# Patient Record
Sex: Male | Born: 1955 | Race: White | Hispanic: No | Marital: Married | Smoking: Never smoker
Health system: Southern US, Community
[De-identification: ages and names within clinical notes are randomized; demographics above are authoritative.]

## PROBLEM LIST (undated history)

## (undated) DIAGNOSIS — I10 Essential (primary) hypertension: Secondary | ICD-10-CM

## (undated) DIAGNOSIS — M542 Cervicalgia: Secondary | ICD-10-CM

## (undated) DIAGNOSIS — E119 Type 2 diabetes mellitus without complications: Secondary | ICD-10-CM

## (undated) DIAGNOSIS — E785 Hyperlipidemia, unspecified: Secondary | ICD-10-CM

## (undated) HISTORY — PX: TONSILLECTOMY: SUR1361

---

## 1998-06-07 ENCOUNTER — Encounter: Admission: RE | Admit: 1998-06-07 | Discharge: 1998-09-05 | Payer: Self-pay | Admitting: Family Medicine

## 2002-04-05 ENCOUNTER — Encounter: Admission: RE | Admit: 2002-04-05 | Discharge: 2002-04-05 | Payer: Self-pay | Admitting: Family Medicine

## 2002-04-12 ENCOUNTER — Encounter: Admission: RE | Admit: 2002-04-12 | Discharge: 2002-04-12 | Payer: Self-pay | Admitting: Family Medicine

## 2002-09-02 ENCOUNTER — Encounter: Admission: RE | Admit: 2002-09-02 | Discharge: 2002-09-02 | Payer: Self-pay | Admitting: Sports Medicine

## 2006-01-15 ENCOUNTER — Ambulatory Visit: Payer: Self-pay | Admitting: Family Medicine

## 2006-01-23 ENCOUNTER — Ambulatory Visit: Payer: Self-pay | Admitting: Family Medicine

## 2006-02-12 ENCOUNTER — Ambulatory Visit: Payer: Self-pay | Admitting: Gastroenterology

## 2006-02-25 ENCOUNTER — Ambulatory Visit: Payer: Self-pay | Admitting: Gastroenterology

## 2010-11-06 ENCOUNTER — Ambulatory Visit: Payer: Self-pay | Admitting: Gastroenterology

## 2010-11-08 LAB — PATHOLOGY REPORT

## 2011-03-13 ENCOUNTER — Encounter: Payer: Self-pay | Admitting: Gastroenterology

## 2012-01-16 ENCOUNTER — Encounter: Payer: Self-pay | Admitting: Gastroenterology

## 2012-01-24 ENCOUNTER — Ambulatory Visit: Payer: Self-pay | Admitting: Family Medicine

## 2013-04-18 IMAGING — CT CT STONE STUDY
1 of 2 series · 15 of 32 positions shown, 19 images · non-contrast
Comparison: none

REASON FOR EXAM: Rt Flank Pain Hx Stones
COMMENTS:

PROCEDURE:     CT  - CT ABDOMEN /PELVIS WO (STONE)  - January 24, 2012  [DATE]
RESULT:     Comparison: None
TECHNIQUE: Multiple axial images from the lung bases to the symphysis pubis
were obtained without oral and without intravenous contrast.

[Series 2: 3mm soft tissue · axial · 0.83mm/px · z∈[-968,-491]mm · 15 of 175 slices shown, 19 images]
[im 8/175  soft-tissue]
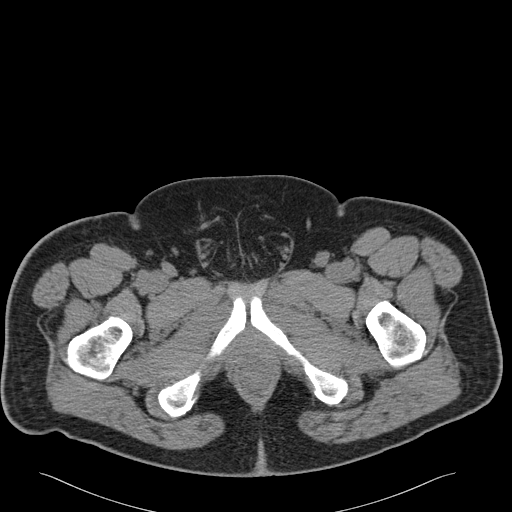
[im 8/175  bone]
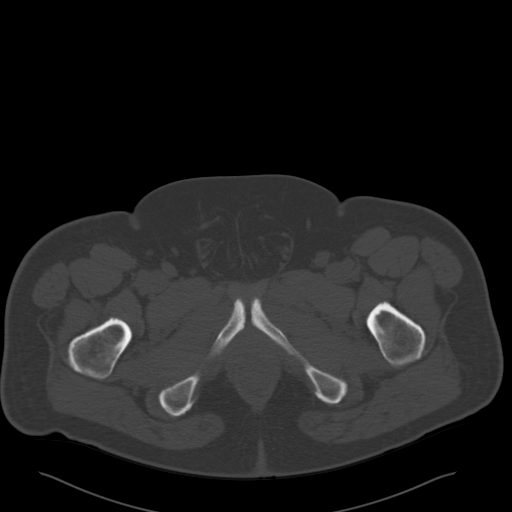
[im 23/175  soft-tissue]
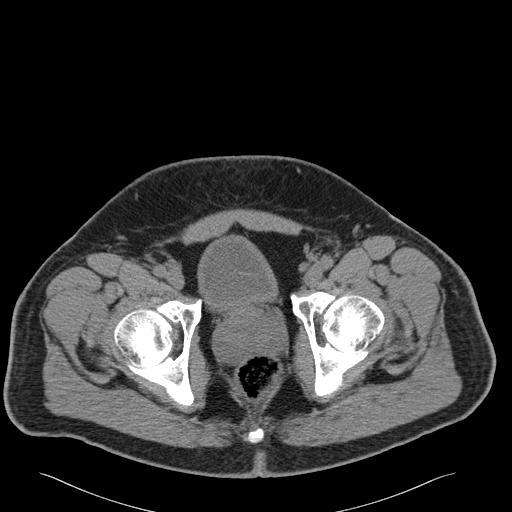
[im 38/175  soft-tissue]
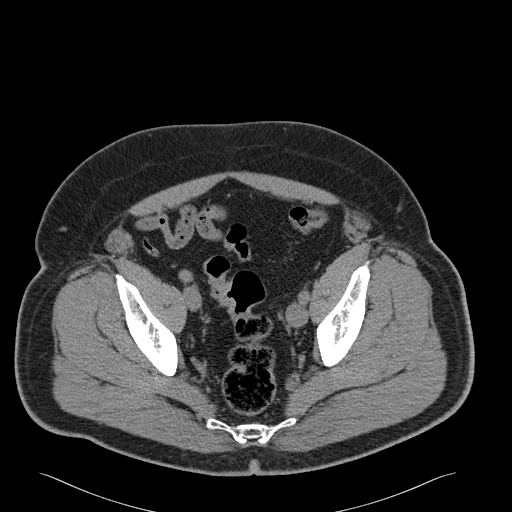
[im 46/175  soft-tissue]
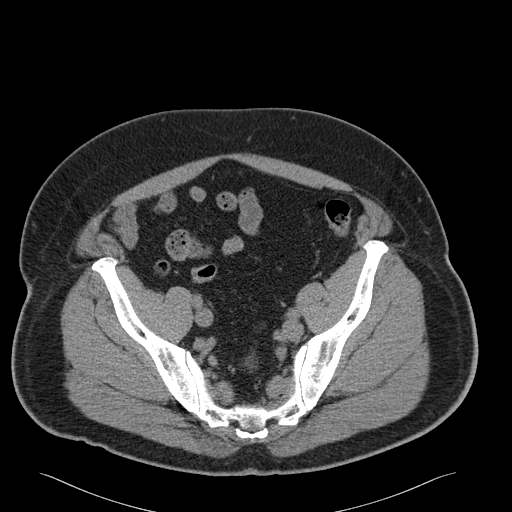
[im 61/175  soft-tissue]
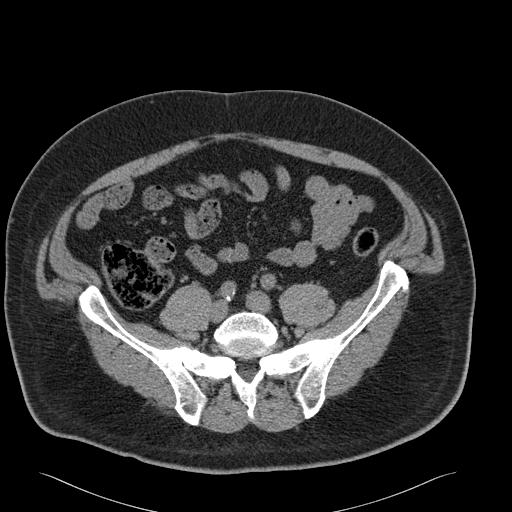
[im 76/175  soft-tissue]
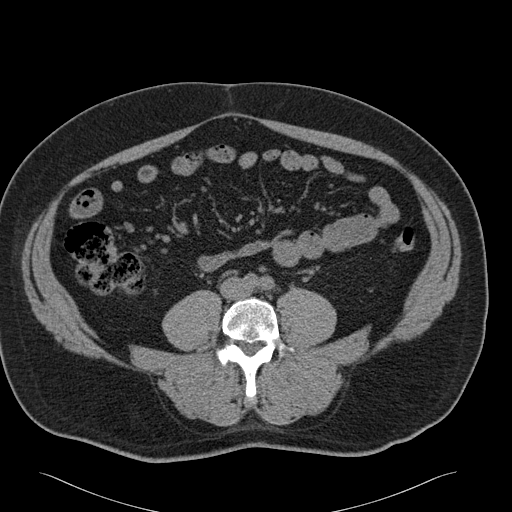
[im 91/175  soft-tissue]
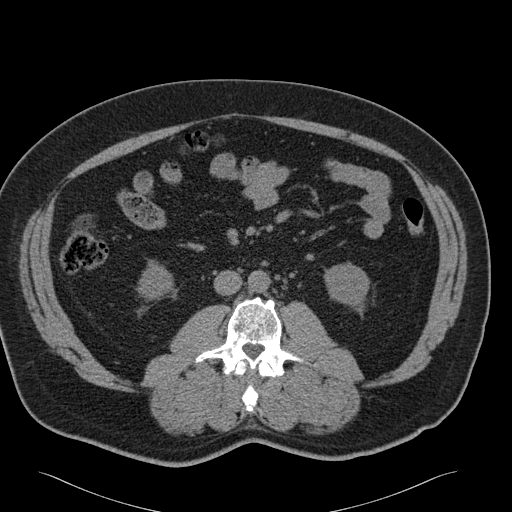
[im 99/175  soft-tissue]
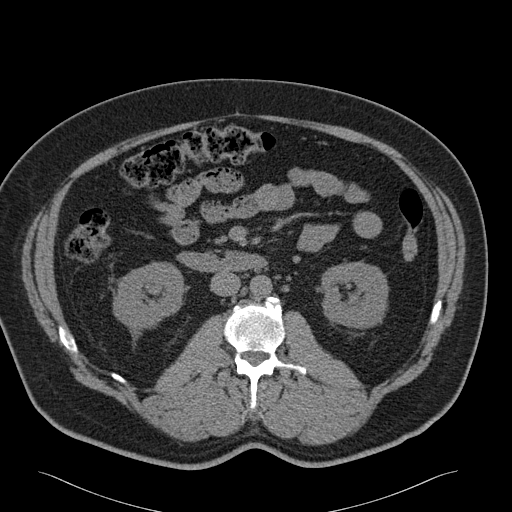
[im 114/175  soft-tissue]
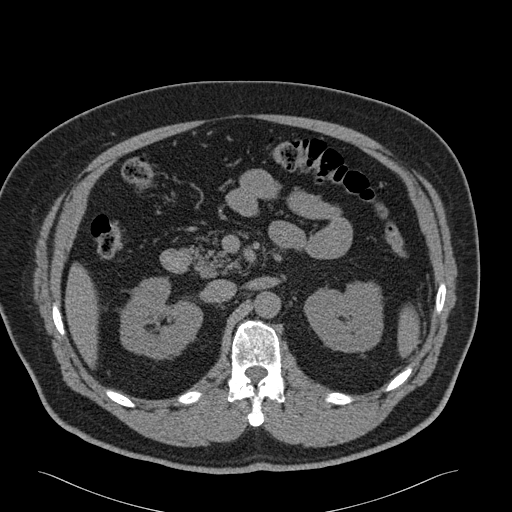
[im 114/175  bone]
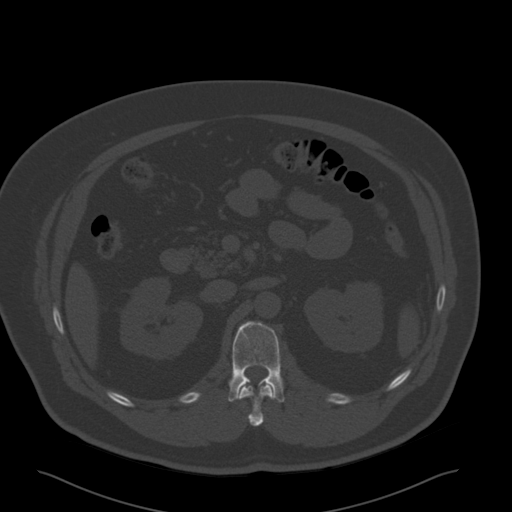
[im 129/175  soft-tissue]
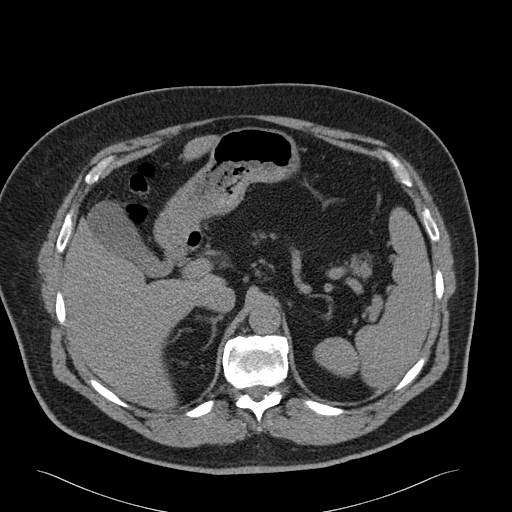
[im 137/175  soft-tissue]
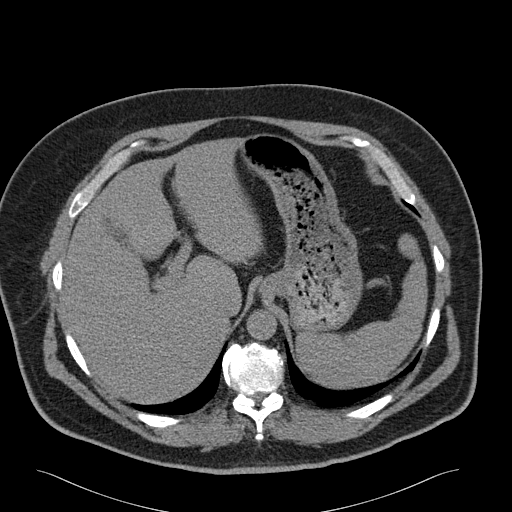
[im 144/175  lung]
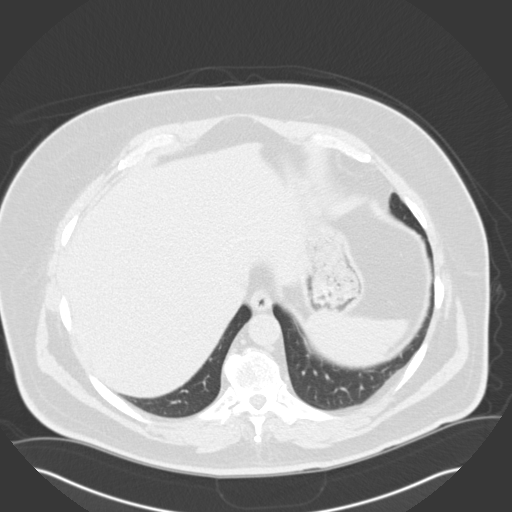
[im 152/175  soft-tissue]
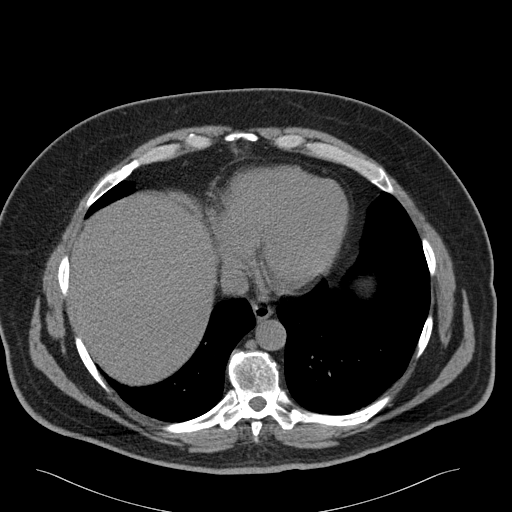
[im 152/175  lung]
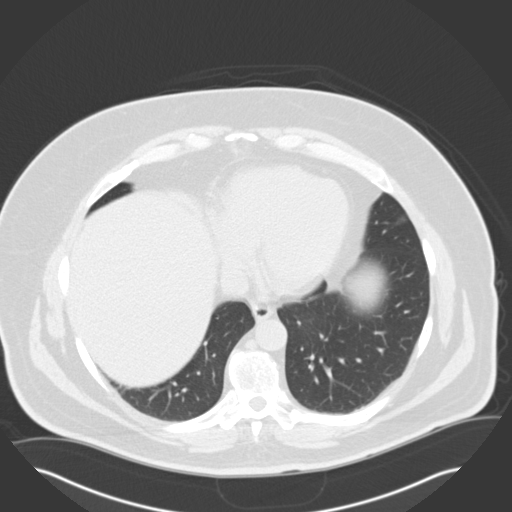
[im 159/175  lung]
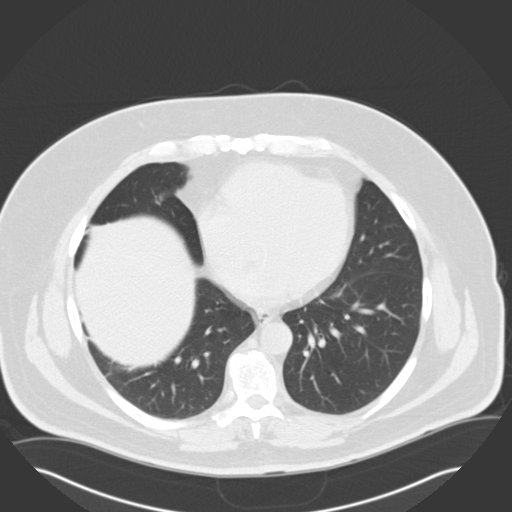
[im 167/175  soft-tissue]
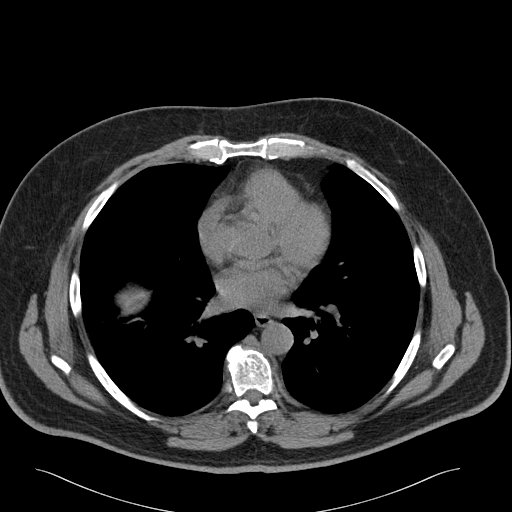
[im 167/175  lung]
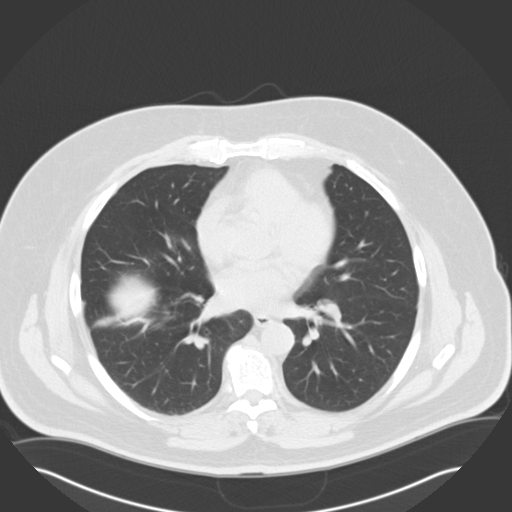

[15 of 32 positions shown; findings below may reference images not displayed]

FINDINGS: Lack of intravenous contrast limits evaluation of the solid abdominal
organs.  Grossly, the liver, gallbladder, spleen, adrenals, and pancreas are
unremarkable. No renal calculi or hydronephrosis. There is mild perinephric
stranding, which is nonspecific. No ureterectasis. Mild prominence of the
bladder wall is likely secondary to underdistention. The prostate is
enlarged. The small and large bowel are normal in caliber. The appendix is
normal. There is a small fat-containing periumbilical hernia.

Small round sclerotic density in the left acetabulum likely represents a
bone island.
IMPRESSION: No renal calculi or hydronephrosis.

## 2015-04-19 ENCOUNTER — Encounter
Admission: RE | Admit: 2015-04-19 | Discharge: 2015-04-19 | Disposition: A | Discharge status: Home / Self Care | Payer: BLUE CROSS/BLUE SHIELD | Source: Ambulatory Visit | Attending: Surgery | Admitting: Surgery

## 2015-04-19 DIAGNOSIS — Z01810 Encounter for preprocedural cardiovascular examination: Secondary | ICD-10-CM | POA: Insufficient documentation

## 2015-04-19 DIAGNOSIS — Z01812 Encounter for preprocedural laboratory examination: Secondary | ICD-10-CM | POA: Insufficient documentation

## 2015-04-19 HISTORY — DX: Type 2 diabetes mellitus without complications: E11.9

## 2015-04-19 LAB — BASIC METABOLIC PANEL
Anion gap: 6 (ref 5–15)
BUN: 20 mg/dL (ref 6–20)
CALCIUM: 9.4 mg/dL (ref 8.9–10.3)
CO2: 29 mmol/L (ref 22–32)
Chloride: 103 mmol/L (ref 101–111)
Creatinine, Ser: 0.77 mg/dL (ref 0.61–1.24)
GFR calc Af Amer: >60 mL/min (ref >60)
GLUCOSE: 184 mg/dL — AB (ref 65–99)
Potassium: 4.3 mmol/L (ref 3.5–5.1)
Sodium: 138 mmol/L (ref 135–145)

## 2015-04-19 NOTE — Patient Instructions (Signed)
  Your procedure is scheduled [id number] 10, 2017 (Tuesday) Report to Day Surgery.[facility]) Second Floor To find out your arrival time please call [phone] between 1PM - 3PM on April 24, 2015 (Monday).  Remember: Instructions that are not followed completely may result in serious medical risk, up to and including death, or upon the discretion of your surgeon and anesthesiologist your surgery may need to be rescheduled.    __x__ 1. Do not eat food or drink liquids after midnight. No gum chewing or hard candies.     ____ 2. No Alcohol for 24 hours before or after surgery.   ____ 3. Bring all medications with you on the day of surgery if instructed.    __x__ 4. Notify your doctor if there is any change in your medical condition     (cold, fever, infections).     Do not wear jewelry, make-up, hairpins, clips or nail polish.  Do not wear lotions, powders, or perfumes. You may wear deodorant.  Do not shave 48 hours prior to surgery. Men may shave face and neck.  Do not bring valuables to the hospital.    [facility] is not responsible for any belongings or valuables.               Contacts, dentures or bridgework may not be worn into surgery.  Leave your suitcase in the car. After surgery it may be brought to your room.  For patients admitted to the hospital, discharge time is determined by your                treatment team.   Patients discharged the day of surgery will not be allowed to drive home.   Please read over the following fact sheets that you were given:   Surgical Site Infection Prevention   ____ Take these medicines the morning of surgery with A SIP OF WATER:    1.   2.   3.   4.  5.  6.  ____ Fleet Enema (as directed)   ____ Use CHG Soap as directed  ____ Use inhalers on the day of surgery  ____ Stop metformin 2 days prior to surgery    ____ Take 1/2 of usual insulin dose the night before surgery and none on the morning of surgery.   __x__ Stop  Coumadin/Plavix/aspirin on (PATIENT STOPPED ASPIRIN ON January 2)  ____ Stop Anti-inflammatories on     _x_ Stop supplements until after surgery. (STOP FISH OIL, CINNAMON AND MULTIVITAMIN NOW)  ____ Bring C-Pap to the hospital.

## 2015-04-25 ENCOUNTER — Encounter: Payer: Self-pay | Admitting: *Deleted

## 2015-04-25 ENCOUNTER — Ambulatory Visit
Admission: RE | Admit: 2015-04-25 | Discharge: 2015-04-25 | Disposition: A | Discharge status: Home / Self Care | Payer: BLUE CROSS/BLUE SHIELD | Source: Ambulatory Visit | Attending: Surgery | Admitting: Surgery

## 2015-04-25 ENCOUNTER — Ambulatory Visit: Payer: BLUE CROSS/BLUE SHIELD | Admitting: Anesthesiology

## 2015-04-25 ENCOUNTER — Encounter
Admission: RE | Disposition: A | Discharge status: Home / Self Care | Payer: Self-pay | Source: Ambulatory Visit | Attending: Surgery

## 2015-04-25 DIAGNOSIS — K429 Umbilical hernia without obstruction or gangrene: Secondary | ICD-10-CM | POA: Diagnosis present

## 2015-04-25 DIAGNOSIS — Z833 Family history of diabetes mellitus: Secondary | ICD-10-CM | POA: Diagnosis not present

## 2015-04-25 DIAGNOSIS — E119 Type 2 diabetes mellitus without complications: Secondary | ICD-10-CM | POA: Diagnosis not present

## 2015-04-25 DIAGNOSIS — I10 Essential (primary) hypertension: Secondary | ICD-10-CM | POA: Diagnosis not present

## 2015-04-25 DIAGNOSIS — Z79899 Other long term (current) drug therapy: Secondary | ICD-10-CM | POA: Diagnosis not present

## 2015-04-25 DIAGNOSIS — Z7982 Long term (current) use of aspirin: Secondary | ICD-10-CM | POA: Insufficient documentation

## 2015-04-25 DIAGNOSIS — Z8249 Family history of ischemic heart disease and other diseases of the circulatory system: Secondary | ICD-10-CM | POA: Insufficient documentation

## 2015-04-25 DIAGNOSIS — E785 Hyperlipidemia, unspecified: Secondary | ICD-10-CM | POA: Insufficient documentation

## 2015-04-25 DIAGNOSIS — Z800 Family history of malignant neoplasm of digestive organs: Secondary | ICD-10-CM | POA: Insufficient documentation

## 2015-04-25 HISTORY — PX: UMBILICAL HERNIA REPAIR: SHX196

## 2015-04-25 LAB — GLUCOSE, CAPILLARY
GLUCOSE-CAPILLARY: 147 mg/dL — AB (ref 65–99)
Glucose-Capillary: 149 mg/dL — ABNORMAL HIGH (ref 65–99)

## 2015-04-25 SURGERY — REPAIR, HERNIA, UMBILICAL, ADULT
Anesthesia: General

## 2015-04-25 MED ORDER — LIDOCAINE HCL (CARDIAC) 20 MG/ML IV SOLN
INTRAVENOUS | Status: DC | PRN
  Administered 2015-04-25: 60 mg via INTRAVENOUS

## 2015-04-25 MED ORDER — BUPIVACAINE-EPINEPHRINE (PF) 0.5% -1:200000 IJ SOLN
INTRAMUSCULAR | Status: AC
  Filled 2015-04-25: qty 30

## 2015-04-25 MED ORDER — SODIUM CHLORIDE 0.9 % IV SOLN
INTRAVENOUS | Status: DC
  Administered 2015-04-25: 12:00:00 via INTRAVENOUS

## 2015-04-25 MED ORDER — CEFAZOLIN SODIUM-DEXTROSE 2-3 GM-% IV SOLR
2.0000 g | Freq: Once | INTRAVENOUS | Status: AC
  Administered 2015-04-25: 2 g via INTRAVENOUS

## 2015-04-25 MED ORDER — MIDAZOLAM HCL 5 MG/5ML IJ SOLN
INTRAMUSCULAR | Status: DC | PRN
  Administered 2015-04-25: 2 mg via INTRAVENOUS

## 2015-04-25 MED ORDER — PROPOFOL 10 MG/ML IV BOLUS
INTRAVENOUS | Status: DC | PRN
  Administered 2015-04-25: 200 mg via INTRAVENOUS

## 2015-04-25 MED ORDER — BUPIVACAINE-EPINEPHRINE 0.5% -1:200000 IJ SOLN
INTRAMUSCULAR | Status: DC | PRN
Start: 2015-04-25 — End: 2015-04-25
  Administered 2015-04-25: 7 mL

## 2015-04-25 MED ORDER — FENTANYL CITRATE (PF) 100 MCG/2ML IJ SOLN
INTRAMUSCULAR | Status: DC | PRN
  Administered 2015-04-25 (×3): 50 ug via INTRAVENOUS

## 2015-04-25 MED ORDER — PROMETHAZINE HCL 25 MG/ML IJ SOLN: 6.2500 mg | INTRAMUSCULAR | Status: DC | PRN

## 2015-04-25 MED ORDER — HYDROCODONE-ACETAMINOPHEN 5-325 MG PO TABS: 1.0000 | ORAL_TABLET | ORAL | Status: DC | PRN

## 2015-04-25 MED ORDER — HYDROCODONE-ACETAMINOPHEN 5-325 MG PO TABS: 1.0000 | ORAL_TABLET | ORAL | Status: AC | PRN

## 2015-04-25 MED ORDER — FENTANYL CITRATE (PF) 100 MCG/2ML IJ SOLN: 25.0000 ug | INTRAMUSCULAR | Status: DC | PRN

## 2015-04-25 MED ORDER — FAMOTIDINE 20 MG PO TABS
ORAL_TABLET | ORAL | Status: AC
  Administered 2015-04-25: 20 mg via ORAL
  Filled 2015-04-25: qty 1

## 2015-04-25 MED ORDER — ONDANSETRON HCL 4 MG/2ML IJ SOLN
INTRAMUSCULAR | Status: DC | PRN
  Administered 2015-04-25: 4 mg via INTRAVENOUS

## 2015-04-25 MED ORDER — CEFAZOLIN SODIUM-DEXTROSE 2-3 GM-% IV SOLR
INTRAVENOUS | Status: AC
  Filled 2015-04-25: qty 50

## 2015-04-25 MED ORDER — FAMOTIDINE 20 MG PO TABS
20.0000 mg | ORAL_TABLET | Freq: Once | ORAL | Status: AC
  Administered 2015-04-25: 20 mg via ORAL

## 2015-04-25 SURGICAL SUPPLY — 25 items
BARD SOFT MESH ×2 IMPLANT
BLADE SURG 15 STRL LF DISP TIS (BLADE) ×1 IMPLANT
BLADE SURG 15 STRL SS (BLADE) ×3
CANISTER SUCT 1200ML W/VALVE (MISCELLANEOUS) ×3 IMPLANT
CHLORAPREP W/TINT 26ML (MISCELLANEOUS) ×3 IMPLANT
DRAPE LAPAROTOMY 77X122 PED (DRAPES) ×3 IMPLANT
GLOVE BIO SURGEON STRL SZ7.5 (GLOVE) ×3 IMPLANT
GOWN STRL REUS W/ TWL LRG LVL3 (GOWN DISPOSABLE) ×3 IMPLANT
GOWN STRL REUS W/TWL LRG LVL3 (GOWN DISPOSABLE) ×9
KIT RM TURNOVER STRD PROC AR (KITS) ×3 IMPLANT
LABEL OR SOLS (LABEL) ×3 IMPLANT
LIQUID BAND (GAUZE/BANDAGES/DRESSINGS) ×3 IMPLANT
MESH SYNTHETIC 4X6 SOFT BARD (Mesh General) ×1 IMPLANT
MESH SYNTHETIC SOFT BARD 4X6 (Mesh General) ×2 IMPLANT
NDL HYPO 25X1 1.5 SAFETY (NEEDLE) ×1 IMPLANT
NEEDLE HYPO 25X1 1.5 SAFETY (NEEDLE) ×3 IMPLANT
NS IRRIG 500ML POUR BTL (IV SOLUTION) ×3 IMPLANT
PACK BASIN MINOR ARMC (MISCELLANEOUS) ×3 IMPLANT
PAD GROUND ADULT SPLIT (MISCELLANEOUS) ×3 IMPLANT
SUT CHROMIC 3 0 SH 27 (SUTURE) ×3 IMPLANT
SUT CHROMIC 4 0 RB 1X27 (SUTURE) ×3 IMPLANT
SUT MNCRL+ 5-0 UNDYED PC-3 (SUTURE) ×1 IMPLANT
SUT MONOCRYL 5-0 (SUTURE) ×2
SUT SURGILON 0 30 BLK (SUTURE) ×6 IMPLANT
SYRINGE 10CC LL (SYRINGE) ×3 IMPLANT

## 2015-04-25 NOTE — Discharge Instructions (Signed)
Take Tylenol or Norco if needed for pain.  May resume aspirin on Thursday.  May shower.  Avoid straining and heavy lifting.  AMBULATORY SURGERY  DISCHARGE INSTRUCTIONS   1) The drugs that you were given will stay in your system until tomorrow so for the next 24 hours you should not:  A) Drive an automobile B) Make any legal decisions C) Drink any alcoholic beverage   2) You may resume regular meals tomorrow.  Today it is better to start with liquids and gradually work up to solid foods.  You may eat anything you prefer, but it is better to start with liquids, then soup and crackers, and gradually work up to solid foods.   3) Please notify your doctor immediately if you have any unusual bleeding, trouble breathing, redness and pain at the surgery site, drainage, fever, or pain not relieved by medication.    4) Additional Instructions:        Please contact your physician with any problems or Same Day Surgery at [phone], Monday through Friday 6 am to 4 pm, or [place] at [facility] number at [phone].

## 2015-04-25 NOTE — Anesthesia Preprocedure Evaluation (Signed)
Anesthesia Evaluation  Patient identified by MRN, date of birth, ID band Patient awake    Reviewed: Allergy & Precautions, H&P , NPO status , Patient's Chart, lab work & pertinent test results, reviewed documented beta blocker date and time   History of Anesthesia Complications Negative for: history of anesthetic complications  Airway Mallampati: I  TM Distance: >3 FB Neck ROM: full    Dental no notable dental hx. (+) Teeth Intact   Pulmonary neg pulmonary ROS,    Pulmonary exam normal breath sounds clear to auscultation       Cardiovascular Exercise Tolerance: Good negative cardio ROS Normal cardiovascular exam Rhythm:regular Rate:Normal     Neuro/Psych negative neurological ROS  negative psych ROS   GI/Hepatic negative GI ROS, Neg liver ROS,   Endo/Other  diabetes, Well Controlled  Renal/GU negative Renal ROS  negative genitourinary   Musculoskeletal   Abdominal   Peds  Hematology negative hematology ROS (+)   Anesthesia Other Findings Past Medical History:   Diabetes mellitus without complication (HCC)                 Reproductive/Obstetrics negative OB ROS                             Anesthesia Physical Anesthesia Plan  ASA: II  Anesthesia Plan: General   Post-op Pain Management:    Induction:   Airway Management Planned:   Additional Equipment:   Intra-op Plan:   Post-operative Plan:   Informed Consent: I have reviewed the patients History and Physical, chart, labs and discussed the procedure including the risks, benefits and alternatives for the proposed anesthesia with the patient or authorized representative who has indicated his/her understanding and acceptance.   Dental Advisory Given  Plan Discussed with: Anesthesiologist, CRNA and Surgeon  Anesthesia Plan Comments:         Anesthesia Quick Evaluation

## 2015-04-25 NOTE — Transfer of Care (Signed)
Immediate Anesthesia Transfer of Care Note  Patient: Glenn Casey  Procedure(s) Performed: Procedure(s): HERNIA REPAIR UMBILICAL ADULT (N/A)  Patient Location: PACU  Anesthesia Type:General  Level of Consciousness: sedated  Airway & Oxygen Therapy: Patient Spontanous Breathing and Patient connected to face mask oxygen  Post-op Assessment: Report given to RN and Post -op Vital signs reviewed and stable  Post vital signs: Reviewed and stable  Last Vitals:  Filed Vitals:   04/25/15 1107 04/25/15 1334  BP: 159/90 121/81  Pulse: 67 69  Temp: 36.8 C 36.3 C  Resp: 16 12    Complications: No apparent anesthesia complications

## 2015-04-25 NOTE — H&P (Signed)
  He reports no change in conditions that they have the office visit.  He is awake alert and oriented. The umbilical hernia was palpated  Have discussed the plan for surgery

## 2015-04-25 NOTE — Anesthesia Procedure Notes (Signed)
Procedure Name: LMA Insertion Date/Time: 04/25/2015 12:19 PM Performed by: Dionne Bucy Pre-anesthesia Checklist: Patient identified, Patient being monitored, Timeout performed, Emergency Drugs available and Suction available Patient Re-evaluated:Patient Re-evaluated prior to inductionOxygen Delivery Method: Circle system utilized Preoxygenation: Pre-oxygenation with 100% oxygen Intubation Type: IV induction Ventilation: Mask ventilation without difficulty LMA: LMA inserted LMA Size: 4.5 Tube type: Oral Number of attempts: 1 Placement Confirmation: positive ETCO2 and breath sounds checked- equal and bilateral Tube secured with: Tape Dental Injury: Teeth and Oropharynx as per pre-operative assessment

## 2015-04-25 NOTE — Op Note (Signed)
OPERATIVE REPORT  PREOPERATIVE  DIAGNOSIS: . Umbilical hernia  POSTOPERATIVE DIAGNOSIS: . Umbilical hernia  PROCEDURE: . Umbilical hernia repair  ANESTHESIA:  General  SURGEON: Rochel Brome  MD   INDICATIONS: . He reports a long history of umbilical hernia and recent development of some pain associated with it. An umbilical hernia was demonstrated on physical exam and repair was recommended for definitive treatment.  With the patient on the operating table in the supine position he was placed under general anesthesia. The abdomen was clipped and prepared with ChloraPrep and draped in a sterile manner.  A transversely oriented 4 cm supraumbilical curvilinear incision was made and carried down through subcutaneous tissues to encounter an umbilical hernia sac. The sac was dissected free from the umbilicus and dissected free from surrounding tissues and dissected free from the fascial ring defect. The properitoneal fat was separated from the fascial ring defect circumferentially extending back approximately 1 cm. Sac was inverted. The fascial ring defect was approximately 2 cm in dimension. Bard soft mesh was cut out to create an oval shape of 2 x 3 cm. This was placed into the properitoneal plane oriented transversely. It was sutured to the overlying fascia with 0 Surgilon through and through sutures with 4 point fixation. The fascia was closed with a transversely oriented suture line of interrupted 0 Surgilon figure-of-eight sutures incorporating each suture into the mesh. The deep fascia and subcutaneous tissues tissues were infiltrated with half percent Sensorcaine with epinephrine. The skin of the umbilicus was sutured to the deep fascia with 3-0 chromic. The skin was closed with running 5-0 Monocryl subcuticular suture and LiquiBand. The patient tolerated surgery satisfactorily and was prepared for transfer to the recovery room.   Rochel Brome M.D.

## 2015-04-26 NOTE — Anesthesia Postprocedure Evaluation (Signed)
Anesthesia Post Note  Patient: Glenn Casey  Procedure(s) Performed: Procedure(s) (LRB): HERNIA REPAIR UMBILICAL ADULT (N/A)  Patient location during evaluation: PACU Anesthesia Type: General Level of consciousness: awake and alert Pain management: pain level controlled Vital Signs Assessment: post-procedure vital signs reviewed and stable Respiratory status: spontaneous breathing, nonlabored ventilation, respiratory function stable and patient connected to nasal cannula oxygen Cardiovascular status: blood pressure returned to baseline and stable Postop Assessment: no signs of nausea or vomiting Anesthetic complications: no    Last Vitals:  Filed Vitals:   04/25/15 1428 04/25/15 1524  BP: 120/74 121/74  Pulse: 60 57  Temp: 35.6 C   Resp: 16 16    Last Pain:  Filed Vitals:   04/26/15 0830  PainSc: 1                  Martha Clan

## 2015-07-13 ENCOUNTER — Encounter: Payer: Self-pay | Admitting: Surgery

## 2019-06-25 ENCOUNTER — Ambulatory Visit: Payer: BLUE CROSS/BLUE SHIELD | Attending: Internal Medicine

## 2019-06-25 DIAGNOSIS — Z23 Encounter for immunization: Secondary | ICD-10-CM

## 2019-06-25 NOTE — Progress Notes (Signed)
   Covid-19 Vaccination Clinic  Name:  JASIYAH DIVELY    MRN: [id number] DOB: Jul 23, 1955  06/25/2019  Mr. Mancil was observed post Covid-19 immunization for 15 minutes without incident. He was provided with Vaccine Information Sheet and instruction to access the V-Safe system.   Mr. Maat was instructed to call 911 with any severe reactions post vaccine: Marland Kitchen Difficulty breathing  . Swelling of face and throat  . A fast heartbeat  . A bad rash all over body  . Dizziness and weakness   Immunizations Administered    Name Date Dose VIS Date Route   Pfizer COVID-19 Vaccine 06/25/2019 12:20 PM 0.3 mL 03/26/2019 Intramuscular   Manufacturer: [place]   Lot: [id number]   [place]: [id number]

## 2019-07-17 ENCOUNTER — Ambulatory Visit: Payer: BLUE CROSS/BLUE SHIELD | Attending: Internal Medicine

## 2019-07-17 ENCOUNTER — Other Ambulatory Visit: Payer: Self-pay

## 2019-07-17 ENCOUNTER — Ambulatory Visit: Payer: BLUE CROSS/BLUE SHIELD

## 2019-07-17 DIAGNOSIS — Z23 Encounter for immunization: Secondary | ICD-10-CM

## 2019-07-17 NOTE — Progress Notes (Signed)
   Covid-19 Vaccination Clinic  Name:  THAI MACKILLOP    MRN: [id number] DOB: [birth year]  07/17/2019  Mr. Schmieder was observed post Covid-19 immunization for 15 minutes without incident. He was provided with Vaccine Information Sheet and instruction to access the V-Safe system.   Mr. Lauby was instructed to call 911 with any severe reactions post vaccine: Marland Kitchen Difficulty breathing  . Swelling of face and throat  . A fast heartbeat  . A bad rash all over body  . Dizziness and weakness   Immunizations Administered    Name Date Dose VIS Date Route   Pfizer COVID-19 Vaccine 07/17/2019  3:44 PM 0.3 mL 03/26/2019 Intramuscular   Manufacturer: [place]   Lot: [id number]   [place]: [id number]

## 2019-07-20 ENCOUNTER — Ambulatory Visit: Payer: BLUE CROSS/BLUE SHIELD

## 2020-02-28 ENCOUNTER — Ambulatory Visit: Payer: BLUE CROSS/BLUE SHIELD | Admitting: Dermatology

## 2021-09-24 ENCOUNTER — Ambulatory Visit: Payer: Medicare Other | Admitting: Anesthesiology

## 2021-09-24 ENCOUNTER — Encounter
Admission: RE | Disposition: A | Discharge status: Home / Self Care | Payer: Self-pay | Source: Home / Self Care | Attending: Gastroenterology

## 2021-09-24 ENCOUNTER — Ambulatory Visit
Admission: RE | Admit: 2021-09-24 | Discharge: 2021-09-24 | Disposition: A | Discharge status: Home / Self Care | Payer: Medicare Other | Attending: Gastroenterology | Admitting: Gastroenterology

## 2021-09-24 ENCOUNTER — Encounter: Payer: Self-pay | Admitting: *Deleted

## 2021-09-24 DIAGNOSIS — Z800 Family history of malignant neoplasm of digestive organs: Secondary | ICD-10-CM | POA: Insufficient documentation

## 2021-09-24 DIAGNOSIS — Z1211 Encounter for screening for malignant neoplasm of colon: Secondary | ICD-10-CM | POA: Diagnosis present

## 2021-09-24 DIAGNOSIS — Z86010 Personal history of colonic polyps: Secondary | ICD-10-CM | POA: Diagnosis not present

## 2021-09-24 DIAGNOSIS — K5730 Diverticulosis of large intestine without perforation or abscess without bleeding: Secondary | ICD-10-CM | POA: Insufficient documentation

## 2021-09-24 DIAGNOSIS — K640 First degree hemorrhoids: Secondary | ICD-10-CM | POA: Insufficient documentation

## 2021-09-24 DIAGNOSIS — E119 Type 2 diabetes mellitus without complications: Secondary | ICD-10-CM | POA: Diagnosis not present

## 2021-09-24 DIAGNOSIS — D122 Benign neoplasm of ascending colon: Secondary | ICD-10-CM | POA: Insufficient documentation

## 2021-09-24 DIAGNOSIS — K648 Other hemorrhoids: Secondary | ICD-10-CM | POA: Diagnosis not present

## 2021-09-24 DIAGNOSIS — D175 Benign lipomatous neoplasm of intra-abdominal organs: Secondary | ICD-10-CM | POA: Insufficient documentation

## 2021-09-24 HISTORY — DX: Hyperlipidemia, unspecified: E78.5

## 2021-09-24 HISTORY — DX: Essential (primary) hypertension: I10

## 2021-09-24 HISTORY — DX: Cervicalgia: M54.2

## 2021-09-24 HISTORY — PX: COLONOSCOPY WITH PROPOFOL: SHX5780

## 2021-09-24 LAB — GLUCOSE, CAPILLARY: Glucose-Capillary: 143 mg/dL — ABNORMAL HIGH (ref 70–99)

## 2021-09-24 SURGERY — COLONOSCOPY WITH PROPOFOL
Anesthesia: General

## 2021-09-24 MED ORDER — PROPOFOL 10 MG/ML IV BOLUS
INTRAVENOUS | Status: AC
  Filled 2021-09-24: qty 40

## 2021-09-24 MED ORDER — SODIUM CHLORIDE 0.9 % IV SOLN
INTRAVENOUS | Status: DC
Start: 2021-09-24 — End: 2021-09-24
  Administered 2021-09-24: 20 mL/h via INTRAVENOUS

## 2021-09-24 MED ORDER — LIDOCAINE HCL (PF) 2 % IJ SOLN
INTRAMUSCULAR | Status: AC
  Filled 2021-09-24: qty 5

## 2021-09-24 MED ORDER — ONDANSETRON HCL 4 MG/2ML IJ SOLN
INTRAMUSCULAR | Status: AC
  Filled 2021-09-24: qty 2

## 2021-09-24 MED ORDER — PROPOFOL 500 MG/50ML IV EMUL
INTRAVENOUS | Status: DC | PRN
  Administered 2021-09-24: 200 ug/kg/min via INTRAVENOUS

## 2021-09-24 MED ORDER — LIDOCAINE HCL (CARDIAC) PF 100 MG/5ML IV SOSY
PREFILLED_SYRINGE | INTRAVENOUS | Status: DC | PRN
  Administered 2021-09-24: 50 mg via INTRAVENOUS

## 2021-09-24 MED ORDER — PROPOFOL 10 MG/ML IV BOLUS
INTRAVENOUS | Status: DC | PRN
  Administered 2021-09-24: 80 mg via INTRAVENOUS

## 2021-09-24 NOTE — Interval H&P Note (Signed)
History and Physical Interval Note:  09/24/2021 7:49 AM  Glenn Casey  has presented today for surgery, with the diagnosis of history colon polyps.  The various methods of treatment have been discussed with the patient and family. After consideration of risks, benefits and other options for treatment, the patient has consented to  Procedure(s) with comments: COLONOSCOPY WITH PROPOFOL (N/A) - DM as a surgical intervention.  The patient's history has been reviewed, patient examined, no change in status, stable for surgery.  I have reviewed the patient's chart and labs.  Questions were answered to the patient's satisfaction.     [name]  Ok to proceed with colonoscopy

## 2021-09-24 NOTE — Transfer of Care (Signed)
Immediate Anesthesia Transfer of Care Note  Patient: Glenn Casey  Procedure(s) Performed: COLONOSCOPY WITH PROPOFOL  Patient Location: PACU  Anesthesia Type:General  Level of Consciousness: sedated  Airway & Oxygen Therapy: Patient Spontanous Breathing  Post-op Assessment: Report given to RN and Post -op Vital signs reviewed and stable  Post vital signs: Reviewed and stable  Last Vitals:  Vitals Value Taken Time  BP 95/65 09/24/21 0816  Temp 35.7 C 09/24/21 0816  Pulse 69 09/24/21 0816  Resp 13 09/24/21 0816  SpO2 98 % 09/24/21 0816    Last Pain:  Vitals:   09/24/21 0816  TempSrc: Temporal  PainSc: Asleep         Complications: No notable events documented.

## 2021-09-24 NOTE — Anesthesia Preprocedure Evaluation (Signed)
Anesthesia Evaluation  Patient identified by MRN, date of birth, ID band Patient awake    Reviewed: Allergy & Precautions, NPO status , Patient's Chart, lab work & pertinent test results  History of Anesthesia Complications Negative for: history of anesthetic complications  Airway Mallampati: III  TM Distance: <3 FB Neck ROM: full    Dental  (+) Chipped   Pulmonary neg pulmonary ROS, neg shortness of breath,    Pulmonary exam normal        Cardiovascular Exercise Tolerance: Good hypertension, (-) anginaNormal cardiovascular exam     Neuro/Psych negative neurological ROS  negative psych ROS   GI/Hepatic negative GI ROS, Neg liver ROS, neg GERD  ,  Endo/Other  diabetes, Type 2  Renal/GU negative Renal ROS  negative genitourinary   Musculoskeletal   Abdominal   Peds  Hematology negative hematology ROS (+)   Anesthesia Other Findings Past Medical History: No date: Cervicalgia No date: Diabetes mellitus without complication (HCC) No date: Hyperlipidemia No date: Hypertension  Past Surgical History: No date: TONSILLECTOMY 0/93/2355: UMBILICAL HERNIA REPAIR; N/A     Comment:  Procedure: HERNIA REPAIR UMBILICAL ADULT;  Surgeon:               Leonie Green, MD;  Location: [facility] ORS;  Service:               General;  Laterality: N/A;  BMI    Body Mass Index: 29.41 kg/m      Reproductive/Obstetrics negative OB ROS                            Anesthesia Physical Anesthesia Plan  ASA: 3  Anesthesia Plan: General   Post-op Pain Management:    Induction: Intravenous  PONV Risk Score and Plan: Propofol infusion and TIVA  Airway Management Planned: Natural Airway and Nasal Cannula  Additional Equipment:   Intra-op Plan:   Post-operative Plan:   Informed Consent: I have reviewed the patients History and Physical, chart, labs and discussed the procedure including the risks,  benefits and alternatives for the proposed anesthesia with the patient or authorized representative who has indicated his/her understanding and acceptance.     Dental Advisory Given  Plan Discussed with: Anesthesiologist, CRNA and Surgeon  Anesthesia Plan Comments: (Patient consented for risks of anesthesia including but not limited to:  - adverse reactions to medications - risk of airway placement if required - damage to eyes, teeth, lips or other oral mucosa - nerve damage due to positioning  - sore throat or hoarseness - Damage to heart, brain, nerves, lungs, other parts of body or loss of life  Patient voiced understanding.)       Anesthesia Quick Evaluation

## 2021-09-24 NOTE — H&P (Signed)
Outpatient short stay form Pre-procedure 09/24/2021  Glenn Rubenstein, MD  Primary Physician: [place] Pink, MD  Reason for visit:  Surveillance  History of present illness:    66 y/o gentleman with history of DM II here for surveillance colonoscopy. No blood thinners. Last colonoscopy was in 2017 with adenomatous polyp. Had grandfather with colon cancer. History of umbilical hernia repair.    Current Facility-Administered Medications:    0.9 %  sodium chloride infusion, , Intravenous, Continuous, Opie Fanton, Hilton Cork, MD, Last Rate: 20 mL/hr at 09/24/21 0714, 20 mL/hr at 09/24/21 0714  Medications Prior to Admission  Medication Sig Dispense Refill Last Dose   aspirin 81 MG tablet Take 81 mg by mouth daily.   Past Week   Cinnamon 500 MG capsule Take 500 mg by mouth 2 (two) times daily.   Past Week   empagliflozin (JARDIANCE) 25 MG TABS tablet Take 25 mg by mouth daily.   Past Week   HYDROcodone-acetaminophen (NORCO) 5-325 MG tablet Take 1-2 tablets by mouth every 4 (four) hours as needed for moderate pain. 12 tablet 0 Past Week   Multiple Vitamin (MULTIVITAMIN) tablet Take 1 tablet by mouth daily.   Past Week   Omega-3 Fatty Acids (FISH OIL) 1000 MG CAPS Take 1,000 mg by mouth 2 (two) times daily.   Past Week   sildenafil (REVATIO) 20 MG tablet Take 20 mg by mouth as needed.   Past Week     No Known Allergies   Past Medical History:  Diagnosis Date   Cervicalgia    Diabetes mellitus without complication ([place])    Hyperlipidemia    Hypertension     Review of systems:  Otherwise negative.    Physical Exam  Gen: Alert, oriented. Appears stated age.  HEENT: PERRLA. Lungs: No respiratory distress CV: RRR Abd: soft, benign, no masses Ext: No edema    Planned procedures: Proceed with colonoscopy. The patient understands the nature of the planned procedure, indications, risks, alternatives and potential complications including but not limited to bleeding, infection,  perforation, damage to internal organs and possible oversedation/side effects from anesthesia. The patient agrees and gives consent to proceed.  Please refer to procedure notes for findings, recommendations and patient disposition/instructions.     Glenn Rubenstein, MD [facility]

## 2021-09-24 NOTE — Anesthesia Postprocedure Evaluation (Signed)
Anesthesia Post Note  Patient: Glenn Casey  Procedure(s) Performed: COLONOSCOPY WITH PROPOFOL  Patient location during evaluation: Endoscopy Anesthesia Type: General Level of consciousness: awake and alert Pain management: pain level controlled Vital Signs Assessment: post-procedure vital signs reviewed and stable Respiratory status: spontaneous breathing, nonlabored ventilation, respiratory function stable and patient connected to nasal cannula oxygen Cardiovascular status: blood pressure returned to baseline and stable Postop Assessment: no apparent nausea or vomiting Anesthetic complications: no   No notable events documented.   Last Vitals:  Vitals:   09/24/21 0826 09/24/21 0836  BP: (!) 109/58 115/73  Pulse: 61 62  Resp: 12 16  Temp:    SpO2: 97% 98%    Last Pain:  Vitals:   09/24/21 0826  TempSrc:   PainSc: 0-No pain                 Precious Haws Colton Tassin

## 2021-09-24 NOTE — Op Note (Signed)
[facility] Gastroenterology Patient Name: Glenn Casey Procedure Date: 09/24/2021 7:12 AM MRN: [id number] Account #: [id number] Date of Birth: May 07, 1955 Admit Type: Outpatient Age: 66 Room: [facility] ENDO ROOM 3 Gender: Male Note Status: Finalized Instrument Name: Jasper Riling [id number] Procedure:             Colonoscopy Indications:           Surveillance: Personal history of adenomatous polyps                         on last colonoscopy > 5 years ago Providers:             Andrey Farmer MD, MD Referring MD:          Irven Easterly. Kary Kos, MD (Referring MD) Medicines:             Monitored Anesthesia Care Complications:         No immediate complications. Estimated blood loss:                         Minimal. Procedure:             Pre-Anesthesia Assessment:                        - Prior to the procedure, a History and Physical was                         performed, and patient medications and allergies were                         reviewed. The patient is competent. The risks and                         benefits of the procedure and the sedation options and                         risks were discussed with the patient. All questions                         were answered and informed consent was obtained.                         Patient identification and proposed procedure were                         verified by the physician, the nurse, the                         anesthesiologist, the anesthetist and the technician                         in the endoscopy suite. Mental Status Examination:                         alert and oriented. Airway Examination: normal                         oropharyngeal airway and neck mobility. Respiratory  Examination: clear to auscultation. CV Examination:                         normal. Prophylactic Antibiotics: The patient does not                         require prophylactic antibiotics. Prior                          Anticoagulants: The patient has taken no previous                         anticoagulant or antiplatelet agents. ASA Grade                         Assessment: II - A patient with mild systemic disease.                         After reviewing the risks and benefits, the patient                         was deemed in satisfactory condition to undergo the                         procedure. The anesthesia plan was to use monitored                         anesthesia care (MAC). Immediately prior to                         administration of medications, the patient was                         re-assessed for adequacy to receive sedatives. The                         heart rate, respiratory rate, oxygen saturations,                         blood pressure, adequacy of pulmonary ventilation, and                         response to care were monitored throughout the                         procedure. The physical status of the patient was                         re-assessed after the procedure.                        After obtaining informed consent, the colonoscope was                         passed under direct vision. Throughout the procedure,                         the patient's blood pressure, pulse, and oxygen  saturations were monitored continuously. The                         Colonoscope was introduced through the anus and                         advanced to the the cecum, identified by appendiceal                         orifice and ileocecal valve. The colonoscopy was                         performed without difficulty. The patient tolerated                         the procedure well. The quality of the bowel                         preparation was good. Findings:      The perianal and digital rectal examinations were normal.      A 4 mm polyp was found in the ascending colon. The polyp was sessile.       The polyp was removed with a cold snare. Resection and  retrieval were       complete. Estimated blood loss was minimal.      There was a small lipoma, in the ascending colon.      A single small-mouthed diverticulum was found in the sigmoid colon.      Internal hemorrhoids were found during retroflexion. The hemorrhoids       were Grade I (internal hemorrhoids that do not prolapse).      The exam was otherwise without abnormality on direct and retroflexion       views. Impression:            - One 4 mm polyp in the ascending colon, removed with                         a cold snare. Resected and retrieved.                        - Small lipoma in the ascending colon.                        - Diverticulosis in the sigmoid colon.                        - Internal hemorrhoids.                        - The examination was otherwise normal on direct and                         retroflexion views. Recommendation:        - Discharge patient to home.                        - Resume previous diet.                        - Continue present medications.                        -  Await pathology results.                        - Repeat colonoscopy in 7 years for surveillance.                        - Return to referring physician as previously                         scheduled. Procedure Code(s):     --- Professional ---                        [phone], Colonoscopy, flexible; with removal of                         tumor(s), polyp(s), or other lesion(s) by snare                         technique Diagnosis Code(s):     --- Professional ---                        Z86.010, Personal history of colonic polyps                        K63.5, Polyp of colon                        D17.5, Benign lipomatous neoplasm of intra-abdominal                         organs                        K64.0, First degree hemorrhoids                        K57.30, Diverticulosis of large intestine without                         perforation or abscess without bleeding CPT  copyright 2019 [employer]. All rights reserved. The codes documented in this report are preliminary and upon coder review may  be revised to meet current compliance requirements. Andrey Farmer MD, MD 09/24/2021 8:22:54 AM Number of Addenda: 0 Note Initiated On: 09/24/2021 7:12 AM Scope Withdrawal Time: 0 hours 9 minutes 17 seconds  Total Procedure Duration: 0 hours 15 minutes 35 seconds  Estimated Blood Loss:  Estimated blood loss was minimal.      [facility]

## 2021-09-25 ENCOUNTER — Encounter: Payer: Self-pay | Admitting: Gastroenterology

## 2021-09-25 LAB — SURGICAL PATHOLOGY

## 2023-09-11 ENCOUNTER — Other Ambulatory Visit: Payer: Self-pay | Admitting: Family Medicine

## 2023-09-11 DIAGNOSIS — E785 Hyperlipidemia, unspecified: Secondary | ICD-10-CM

## 2023-09-12 ENCOUNTER — Ambulatory Visit
Admission: RE | Admit: 2023-09-12 | Discharge: 2023-09-12 | Disposition: A | Discharge status: Home / Self Care | Payer: Self-pay | Source: Ambulatory Visit | Attending: Family Medicine | Admitting: Family Medicine

## 2023-09-12 DIAGNOSIS — E785 Hyperlipidemia, unspecified: Secondary | ICD-10-CM | POA: Insufficient documentation

## 2024-03-18 ENCOUNTER — Emergency Department

## 2024-03-18 ENCOUNTER — Emergency Department
Admission: EM | Admit: 2024-03-18 | Discharge: 2024-03-18 | Disposition: A | Discharge status: Home / Self Care | Attending: Emergency Medicine | Admitting: Emergency Medicine

## 2024-03-18 ENCOUNTER — Other Ambulatory Visit: Payer: Self-pay

## 2024-03-18 DIAGNOSIS — I10 Essential (primary) hypertension: Secondary | ICD-10-CM | POA: Diagnosis not present

## 2024-03-18 DIAGNOSIS — E119 Type 2 diabetes mellitus without complications: Secondary | ICD-10-CM | POA: Insufficient documentation

## 2024-03-18 DIAGNOSIS — M7989 Other specified soft tissue disorders: Secondary | ICD-10-CM | POA: Diagnosis present

## 2024-03-18 DIAGNOSIS — I82412 Acute embolism and thrombosis of left femoral vein: Secondary | ICD-10-CM | POA: Diagnosis not present

## 2024-03-18 MED ORDER — APIXABAN (ELIQUIS) VTE STARTER PACK (10MG AND 5MG): ORAL_TABLET | ORAL | 0 refills | Status: AC

## 2024-03-18 NOTE — ED Triage Notes (Signed)
 Pt to ED via POV from home. Pt reports ongoing left ankle swelling and foot swelling x1 month. Some soreness. Pt sent for DVT rule out.

## 2024-03-18 NOTE — ED Notes (Signed)
 Ultrasound at bedside

## 2024-03-18 NOTE — Progress Notes (Signed)
 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 Nonnie  SWELLING LEFT FOOT TO MID CALF.  NEG HOMANS. HX OF DIABETIC NEUROPATHY.  NO INJURY  CONCERN FOR DVT. NO REDNESS OR INCREASED WARMTH.  GOOD CAP REFILL. GOOD DP PULSE.

## 2024-03-18 NOTE — ED Provider Notes (Signed)
 [facility] Provider Note   Event Date/Time   First MD Initiated Contact with Patient 03/18/24 1103     (approximate) History  Leg Swelling (/)  HPI Glenn Casey is a 68 y.o. male with a past medical history of type 2 diabetes, hypertension, hyperlipidemia who presents complaining of ongoing left ankle and foot swelling for the last month who was sent by his primary care physician for DVT rule out.  Patient endorses mild pain intermittently over this area and does not endorse any exacerbating or relieving factors.  Patient denies any recent trauma or significant amount of time staying sedentary ROS: Patient currently denies any vision changes, tinnitus, difficulty speaking, facial droop, sore throat, chest pain, shortness of breath, abdominal pain, nausea/vomiting/diarrhea, dysuria, or weakness/numbness in any extremity   Physical Exam  Triage Vital Signs: ED Triage Vitals [03/18/24 1056]  Encounter Vitals Group     BP (!) 168/86     Girls Systolic BP Percentile      Girls Diastolic BP Percentile      Boys Systolic BP Percentile      Boys Diastolic BP Percentile      Pulse Rate 78     Resp 18     Temp 98.1 F (36.7 C)     Temp Source Oral     SpO2 98 %     Weight      Height      Head Circumference      Peak Flow      Pain Score 0     Pain Loc      Pain Education      Exclude from Growth Chart    Most recent vital signs: Vitals:   03/18/24 1056 03/18/24 1152  BP: (!) 168/86 131/86  Pulse: 78 71  Resp: 18 16  Temp: 98.1 F (36.7 C)   SpO2: 98% 98%   General: Awake, oriented x4. CV:  Good peripheral perfusion. Resp:  Normal effort. Abd:  No distention. Other:  Elderly resting comfortably in no acute distress ED Results / Procedures / Treatments  Labs (all labs ordered are listed, but only abnormal results are displayed) Labs Reviewed - No data to display  RADIOLOGY ED MD interpretation: Doppler ultrasound of the left lower extremity  shows positive subacute near/nonocclusive left femoral-popliteal DVT - All radiology independently interpreted and agree with radiology assessment Official radiology report(s): US  Venous Img Lower Unilateral Left Result Date: 03/18/2024 CLINICAL DATA:  lef swelling.  LEFT leg swelling x1 month. EXAM: LEFT LOWER EXTREMITY VENOUS DOPPLER ULTRASOUND TECHNIQUE: Gray-scale sonography with compression, as well as color and duplex ultrasound, were performed to evaluate the deep venous system(s) from the level of the common femoral vein through the popliteal and proximal calf veins. COMPARISON:  CT AP, 01/24/2012. FINDINGS: VENOUS Normal compressibility of the common femoral, as well as the visualized calf veins. Visualized portions of profunda femoral vein and great saphenous vein unremarkable. Heterogeneously-echogenic, near-occlusive filling defect within the imaged portions of the peripheral femoral and popliteal vein, and with incomplete incompressibility. Limited views of the contralateral common femoral vein are unremarkable. OTHER No evidence of superficial thrombophlebitis or abnormal fluid collection. Limitations: none IMPRESSION: Examination is POSITIVE for subacute, near/non-occlusive LEFT femoropopliteal DVT. Thom Hall, MD [facility] Electronically Signed   By: Thom Hall M.D.   On: 03/18/2024 12:04   PROCEDURES: Critical Care performed: Yes, see critical care procedure note(s) Procedures CRITICAL CARE Performed by: Yalissa Fink K  Aldora Perman  Total critical care time: 33 minutes  Critical care time was exclusive of separately billable procedures and treating other patients.  Critical care was necessary to treat or prevent imminent or life-threatening deterioration.  Critical care was time spent personally by me on the following activities: development of treatment plan with patient and/or surrogate as well as nursing, discussions with  consultants, evaluation of patient's response to treatment, examination of patient, obtaining history from patient or surrogate, ordering and performing treatments and interventions, ordering and review of laboratory studies, ordering and review of radiographic studies, pulse oximetry and re-evaluation of patient's condition.  MEDICATIONS ORDERED IN ED: Medications - No data to display IMPRESSION / MDM / ASSESSMENT AND PLAN / ED COURSE  I reviewed the triage vital signs and the nursing notes.                             The patient is on the cardiac monitor to evaluate for evidence of arrhythmia and/or significant heart rate changes. Patient's presentation is most consistent with acute presentation with potential threat to life or bodily function. 68 year old male presents with unilateral leg swelling and pain for 1 month Nontoxic appearing, VSS. No lymphangitic spread visible. No fluid pockets or fluctuance concerning for abscess noted. Low concern for cellulitis or osteomyelitis. No evidence of phlegmasia cerulea or alba dolens. Focal and unilateral nature not consistent with heart failure.  Workup US  Venous Doppler Lower Extremity  Findings: Left nonocclusive femoral-popliteal DVT  Rx: Eliquis starter pack  Disposition: Discharge with appropriate follow up Instructed patient to follow up with primary care physician in next 48 hours as well.   FINAL CLINICAL IMPRESSION(S) / ED DIAGNOSES   Final diagnoses:  Acute deep vein thrombosis (DVT) of femoral vein of left lower extremity (HCC)   Rx / DC Orders   ED Discharge Orders          Ordered    APIXABAN (ELIQUIS) VTE STARTER PACK (10MG  AND 5MG )       Note to Pharmacy: If starter pack unavailable, substitute with seventy-four 5 mg apixaban tabs following the above SIG directions.   03/18/24 1218           Note:  This document was prepared using Dragon voice recognition software and may include unintentional dictation  errors.   Crystle Carelli K, MD 03/18/24 1228
